# Patient Record
Sex: Male | Born: 1990 | Race: White | Hispanic: No | Marital: Single | Smoking: Never smoker
Health system: Southern US, Community
[De-identification: ages and names within clinical notes are randomized; demographics above are authoritative.]

## PROBLEM LIST (undated history)

## (undated) HISTORY — PX: CYST EXCISION: SHX5701

## (undated) HISTORY — PX: VARICOCELE EXCISION: SUR582

---

## 2015-08-11 ENCOUNTER — Encounter: Payer: Self-pay | Admitting: Emergency Medicine

## 2015-08-11 ENCOUNTER — Ambulatory Visit
Admission: EM | Admit: 2015-08-11 | Discharge: 2015-08-11 | Disposition: A | Discharge status: Home / Self Care | Payer: 59 | Attending: Family Medicine | Admitting: Family Medicine

## 2015-08-11 DIAGNOSIS — J029 Acute pharyngitis, unspecified: Secondary | ICD-10-CM

## 2015-08-11 DIAGNOSIS — J019 Acute sinusitis, unspecified: Secondary | ICD-10-CM | POA: Diagnosis not present

## 2015-08-11 LAB — RAPID STREP SCREEN (MED CTR MEBANE ONLY): STREPTOCOCCUS, GROUP A SCREEN (DIRECT): NEGATIVE

## 2015-08-11 MED ORDER — AZITHROMYCIN 250 MG PO TABS: ORAL_TABLET | ORAL | Status: DC

## 2015-08-11 MED ORDER — FEXOFENADINE-PSEUDOEPHED ER 180-240 MG PO TB24: 1.0000 | ORAL_TABLET | Freq: Every day | ORAL | Status: DC

## 2015-08-11 MED ORDER — HYDROCOD POLST-CPM POLST ER 10-8 MG/5ML PO SUER
5.0000 mL | Freq: Two times a day (BID) | ORAL | Status: DC | PRN
Start: 2015-08-11 — End: 2020-02-19

## 2015-08-11 NOTE — ED Provider Notes (Signed)
CSN: [id number]     Arrival date & time 08/11/15  1821 History   First MD Initiated Contact with Patient 08/11/15 1922    Nurses notes were reviewed. Chief Complaint  Patient presents with  . Chest Pain  . Cough   Patient was having a cough for week reports nasal congestion dripping down the back of his throat. He reports status post and sore and is unable to sleep because of the congestion.  (Consider location/radiation/quality/duration/timing/severity/associated sxs/prior Treatment) Patient is a 24 y.o. male presenting with cough and URI. The history is provided by the patient. No language interpreter was used.  Cough Associated symptoms: no headaches and no myalgias   URI Presenting symptoms: cough   Severity:  Moderate Timing:  Constant Progression:  Worsening Chronicity:  New Relieved by:  Nothing Ineffective treatments:  None tried Associated symptoms: sinus pain and swollen glands   Associated symptoms: no arthralgias, no headaches, no myalgias, no neck pain and no sneezing   Risk factors: not elderly, no chronic cardiac disease, no chronic kidney disease, no chronic respiratory disease, no diabetes mellitus, no recent illness and no recent travel      History reviewed. No pertinent past medical history. Past Surgical History  Procedure Laterality Date  . Cyst excision     History reviewed. No pertinent family history. Social History  Substance Use Topics  . Smoking status: Never Smoker   . Smokeless tobacco: None  . Alcohol Use: Yes    Review of Systems  HENT: Negative for sneezing.   Respiratory: Positive for cough.   Musculoskeletal: Negative for myalgias, arthralgias and neck pain.  Neurological: Negative for headaches.  All other systems reviewed and are negative.   Allergies  Review of patient's allergies indicates no known allergies.  Home Medications   Prior to Admission medications   Medication Sig Start Date End Date Taking? Authorizing  Provider  Dextromethorphan-Guaifenesin (ROBITUSSIN DM PO) Take by mouth.   Yes Historical Provider, MD  Pseudoephedrine HCl (SUDAFED 24 HOUR PO) Take by mouth.   Yes Historical Provider, MD  azithromycin (ZITHROMAX Z-PAK) 250 MG tablet Take 2 tablets first day and then 1 po a day for 4 days 08/11/15   Hassan RowanEugene Marquavion Venhuizen, MD  chlorpheniramine-HYDROcodone [facility](TUSSIONEX PENNKINETIC ER) 10-8 MG/5ML SUER Take 5 mLs by mouth every 12 (twelve) hours as needed for cough. 08/11/15   Hassan RowanEugene Cabria Micalizzi, MD  fexofenadine-pseudoephedrine (ALLEGRA-D ALLERGY & CONGESTION) 180-240 MG 24 hr tablet Take 1 tablet by mouth daily. 08/11/15   Hassan RowanEugene Annalea Alguire, MD   Meds Ordered and Administered this Visit  Medications - No data to display  BP 126/75 mmHg  Pulse 72  Temp(Src) 98 F (36.7 C) (Oral)  Resp 18  Ht 6\' 2"  (1.88 m)  Wt 215 lb (97.523 kg)  BMI 27.59 kg/m2  SpO2 99% No data found.   Physical Exam  Constitutional: He is oriented to person, place, and time. He appears well-developed and well-nourished.  HENT:  Head: Normocephalic and atraumatic.  Right Ear: External ear normal.  Left Ear: External ear normal.  Mouth/Throat: Oropharynx is clear and moist.  Eyes: Pupils are equal, round, and reactive to light.  Neck: Normal range of motion. Neck supple.  Cardiovascular: Normal rate, regular rhythm and normal heart sounds.   Pulmonary/Chest: Effort normal and breath sounds normal.  Musculoskeletal: Normal range of motion. He exhibits edema.  Lymphadenopathy:    He has cervical adenopathy.  Neurological: He is alert and oriented to person, place, and time.  Skin: Skin  is warm and dry.  Vitals reviewed.   ED Course  Procedures (including critical care time)  Labs Review Labs Reviewed  RAPID STREP SCREEN (NOT AT [facility])  CULTURE, GROUP A STREP ([facility] ONLY)    Imaging Review No results found.   Visual Acuity Review  Right Eye Distance:   Left Eye Distance:   Bilateral Distance:    Right Eye Near:   Left  Eye Near:    Bilateral Near:     Results for orders placed or performed during the hospital encounter of 08/11/15  Rapid strep screen  Result Value Ref Range   Streptococcus, Group A Screen (Direct) NEGATIVE NEGATIVE      MDM   1. Acute sinusitis, recurrence not specified, unspecified location   2. Pharyngitis    Due to congestion and cough lasting for over a week we'll place him on Z-Pak for the nocturnal cough Tussionex 1 teaspoon twice a day and Allegra-D. If strep test is positive we'll change to amoxicillin. Work note for tomorrow given as well. Follow-up PCP if not better or improving.,    Hassan Rowan, MD 08/11/15 2019

## 2015-08-11 NOTE — Discharge Instructions (Signed)
Pharyngitis Pharyngitis is a sore throat (pharynx). There is redness, pain, and swelling of your throat. HOME CARE   Drink enough fluids to keep your pee (urine) clear or pale yellow.  Only take medicine as told by your doctor.  You may get sick again if you do not take medicine as told. Finish your medicines, even if you start to feel better.  Do not take aspirin.  Rest.  Rinse your mouth (gargle) with salt water ( tsp of salt per 1 qt of water) every 1-2 hours. This will help the pain.  If you are not at risk for choking, you can suck on hard candy or sore throat lozenges. GET HELP IF:  You have large, tender lumps on your neck.  You have a rash.  You cough up green, yellow-brown, or bloody spit. GET HELP RIGHT AWAY IF:   You have a stiff neck.  You drool or cannot swallow liquids.  You throw up (vomit) or are not able to keep medicine or liquids down.  You have very bad pain that does not go away with medicine.  You have problems breathing (not from a stuffy nose). MAKE SURE YOU:   Understand these instructions.  Will watch your condition.  Will get help right away if you are not doing well or get worse.   This information is not intended to replace advice given to you by your health care provider. Make sure you discuss any questions you have with your health care provider.   Document Released: 01/24/2008 Document Revised: 05/28/2013 Document Reviewed: 04/14/2013 Elsevier Interactive Patient Education 2016 Elsevier Inc.  Sinusitis, Adult Sinusitis is redness, soreness, and puffiness (inflammation) of the air pockets in the bones of your face (sinuses). The redness, soreness, and puffiness can cause air and mucus to get trapped in your sinuses. This can allow germs to grow and cause an infection.  HOME CARE   Drink enough fluids to keep your pee (urine) clear or pale yellow.  Use a humidifier in your home.  Run a hot shower to create steam in the bathroom.  Sit in the bathroom with the door closed. Breathe in the steam 3-4 times a day.  Put a warm, moist washcloth on your face 3-4 times a day, or as told by your doctor.  Use salt water sprays (saline sprays) to wet the thick fluid in your nose. This can help the sinuses drain.  Only take medicine as told by your doctor. GET HELP RIGHT AWAY IF:   Your pain gets worse.  You have very bad headaches.  You are sick to your stomach (nauseous).  You throw up (vomit).  You are very sleepy (drowsy) all the time.  Your face is puffy (swollen).  Your vision changes.  You have a stiff neck.  You have trouble breathing. MAKE SURE YOU:   Understand these instructions.  Will watch your condition.  Will get help right away if you are not doing well or get worse.   This information is not intended to replace advice given to you by your health care provider. Make sure you discuss any questions you have with your health care provider.   Document Released: 01/24/2008 Document Revised: 08/28/2014 Document Reviewed: 03/12/2012 Elsevier Interactive Patient Education [employer] Elsevier Inc.

## 2015-08-11 NOTE — ED Notes (Signed)
Pt reports cough for a week, then chest pain started thinks from coughing so much. Pt denies SOB.

## 2015-08-13 LAB — CULTURE, GROUP A STREP (THRC)

## 2017-04-12 ENCOUNTER — Encounter: Payer: Self-pay | Admitting: Emergency Medicine

## 2017-04-12 ENCOUNTER — Emergency Department
Admission: EM | Admit: 2017-04-12 | Discharge: 2017-04-12 | Disposition: A | Discharge status: Home / Self Care | Payer: BLUE CROSS/BLUE SHIELD | Attending: Emergency Medicine | Admitting: Emergency Medicine

## 2017-04-12 ENCOUNTER — Emergency Department: Payer: BLUE CROSS/BLUE SHIELD

## 2017-04-12 DIAGNOSIS — Y999 Unspecified external cause status: Secondary | ICD-10-CM | POA: Diagnosis not present

## 2017-04-12 DIAGNOSIS — Z79899 Other long term (current) drug therapy: Secondary | ICD-10-CM | POA: Insufficient documentation

## 2017-04-12 DIAGNOSIS — S199XXA Unspecified injury of neck, initial encounter: Secondary | ICD-10-CM | POA: Diagnosis present

## 2017-04-12 DIAGNOSIS — S161XXA Strain of muscle, fascia and tendon at neck level, initial encounter: Secondary | ICD-10-CM

## 2017-04-12 DIAGNOSIS — Y92410 Unspecified street and highway as the place of occurrence of the external cause: Secondary | ICD-10-CM | POA: Diagnosis not present

## 2017-04-12 DIAGNOSIS — Y939 Activity, unspecified: Secondary | ICD-10-CM | POA: Diagnosis not present
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
## 2017-04-12 NOTE — ED Notes (Signed)
Patient transported to CT 

## 2017-04-12 NOTE — Discharge Instructions (Signed)
Use Advil or Tylenol for the pain. Return if you get a bad headache worse neck pain fever vomiting or any other new symptoms. Follow-up with your doctor next week unless your well.
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
## 2017-04-12 NOTE — ED Notes (Signed)
Pt had dog tags necklace on. Given to dad at bedside.

## 2017-04-12 NOTE — ED Notes (Signed)
ED Provider at bedside. 
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
## 2019-11-24 ENCOUNTER — Ambulatory Visit: Payer: BC Managed Care – PPO | Attending: Internal Medicine

## 2019-11-24 DIAGNOSIS — Z23 Encounter for immunization: Secondary | ICD-10-CM

## 2019-11-24 NOTE — Progress Notes (Signed)
   Covid-19 Vaccination Clinic  Name:  Andrew Stanley    MRN: [id number] DOB: [birth year]  11/24/2019  [name] was observed post Covid-19 immunization for 15 minutes without incident. He was provided with Vaccine Information Sheet and instruction to access the V-Safe system.   [name] was instructed to call 911 with any severe reactions post vaccine: Marland Kitchen Difficulty breathing  . Swelling of face and throat  . A fast heartbeat  . A bad rash all over body  . Dizziness and weakness   Immunizations Administered    Name Date Dose VIS Date Route   Pfizer COVID-19 Vaccine 11/24/2019  8:50 AM 0.3 mL 08/01/2019 Intramuscular   Manufacturer: [employer], [employer]   Lot: [id number]   NDC: [address]-8

## 2019-12-17 ENCOUNTER — Ambulatory Visit: Payer: BC Managed Care – PPO | Attending: Internal Medicine

## 2019-12-17 DIAGNOSIS — Z23 Encounter for immunization: Secondary | ICD-10-CM

## 2019-12-17 NOTE — Progress Notes (Signed)
   Covid-19 Vaccination Clinic  Name:  HORRIS SPEROS    MRN: [id number] DOB: October 04, 1990  12/17/2019  Mr. Grounds was observed post Covid-19 immunization for 15 minutes without incident. He was provided with Vaccine Information Sheet and instruction to access the V-Safe system.   Mr. Rafter was instructed to call 911 with any severe reactions post vaccine: Marland Kitchen Difficulty breathing  . Swelling of face and throat  . A fast heartbeat  . A bad rash all over body  . Dizziness and weakness   Immunizations Administered    Name Date Dose VIS Date Route   Pfizer COVID-19 Vaccine 12/17/2019  9:30 AM 0.3 mL 10/15/2018 Intramuscular   Manufacturer: [employer], [employer]   Lot: [id number]   NDC: [address]-6

## 2020-02-19 ENCOUNTER — Other Ambulatory Visit: Payer: Self-pay

## 2020-02-19 ENCOUNTER — Emergency Department: Payer: BC Managed Care – PPO

## 2020-02-19 ENCOUNTER — Emergency Department
Admission: EM | Admit: 2020-02-19 | Discharge: 2020-02-19 | Disposition: A | Discharge status: Home / Self Care | Payer: BC Managed Care – PPO | Attending: Emergency Medicine | Admitting: Emergency Medicine

## 2020-02-19 DIAGNOSIS — R079 Chest pain, unspecified: Secondary | ICD-10-CM

## 2020-02-19 LAB — CBC
HCT: 39.9 % (ref 39.0–52.0)
Hemoglobin: 14.2 g/dL (ref 13.0–17.0)
MCH: 33.4 pg (ref 26.0–34.0)
MCHC: 35.6 g/dL (ref 30.0–36.0)
MCV: 93.9 fL (ref 80.0–100.0)
Platelets: 156 10*3/uL (ref 150–400)
RBC: 4.25 MIL/uL (ref 4.22–5.81)
RDW: 11.9 % (ref 11.5–15.5)
WBC: 5.6 10*3/uL (ref 4.0–10.5)
nRBC: 0 % (ref 0.0–0.2)

## 2020-02-19 LAB — BASIC METABOLIC PANEL
Anion gap: 8 (ref 5–15)
BUN: 19 mg/dL (ref 6–20)
CO2: 28 mmol/L (ref 22–32)
Calcium: 9.2 mg/dL (ref 8.9–10.3)
Chloride: 105 mmol/L (ref 98–111)
Creatinine, Ser: 1.18 mg/dL (ref 0.61–1.24)
GFR calc Af Amer: >60 mL/min (ref >60)
GFR calc non Af Amer: >60 mL/min (ref >60)
Glucose, Bld: 123 mg/dL — ABNORMAL HIGH (ref 70–99)
Potassium: 3.6 mmol/L (ref 3.5–5.1)
Sodium: 141 mmol/L (ref 135–145)

## 2020-02-19 LAB — TROPONIN I (HIGH SENSITIVITY)
Troponin I (High Sensitivity): 3 ng/L (ref <18)
Troponin I (High Sensitivity): <2 ng/L (ref <18)

## 2020-02-19 NOTE — ED Provider Notes (Signed)
[facility] Emergency Department Provider Note ____________________________________________   First MD Initiated Contact with Patient 02/19/20 [phone]     (approximate)  I have reviewed the triage vital signs and the nursing notes.   HISTORY  Chief Complaint Chest Pain    HPI Andrew Stanley is a 29 y.o. male with no chronic medical issues who presents for evaluation of about 4 days of intermittent but recurrent pain in his chest.  He reports that the pain is in the center of his chest, sometimes at the lower part and sometimes rating up higher, and feels like a dull aching pressure.  It is worse at night and when he is lying flat.  He has been trying not to worry about it but tonight when he was trying to sleep it was severe and nothing in particular was making it better so he came to the emergency department.  He also reports that he was having what folic some numbness in his left arm but that went away after he got to the emergency department.  He has had no focal weakness.  He denies fever, chills, nausea, vomiting, sore throat, shortness of breath, cough, abdominal pain, and dysuria.  He has not had similar symptoms in the past.  He is unaware of having any acid reflux.   He does not have a history of blood clots in the legs of the lungs.      History reviewed. No pertinent past medical history.  There are no problems to display for this patient.   Past Surgical History:  Procedure Laterality Date  . CYST EXCISION    . VARICOCELE EXCISION      Prior to Admission medications   Medication Sig Start Date End Date Taking? Authorizing Provider  Dextromethorphan-Guaifenesin (ROBITUSSIN DM PO) Take by mouth.   Yes [provider]  fexofenadine (ALLEGRA ALLERGY) 180 MG tablet Take 180 mg by mouth daily.   Yes [provider]  Pseudoephedrine HCl (SUDAFED 24 HOUR PO) Take by mouth.   Yes [provider]    Allergies Patient has  no known allergies.  No family history on file.  Social History Social History   Tobacco Use  . Smoking status: Never Smoker  . Smokeless tobacco: Never Used  Substance Use Topics  . Alcohol use: Yes  . Drug use: Not on file    Review of Systems Constitutional: No fever/chills Eyes: No visual changes. ENT: No sore throat. Cardiovascular: +chest pain. Respiratory: Denies shortness of breath. Gastrointestinal: No abdominal pain.  No nausea, no vomiting.  No diarrhea.  No constipation. Genitourinary: Negative for dysuria. Musculoskeletal: Negative for neck pain.  Negative for back pain. Integumentary: Negative for rash. Neurological: Negative for headaches, focal weakness or numbness.   ____________________________________________   PHYSICAL EXAM:  VITAL SIGNS: ED Triage Vitals  Enc Vitals Group     BP 02/19/20 0053 117/69     Pulse Rate 02/19/20 0053 79     Resp 02/19/20 0053 18     Temp 02/19/20 0053 98.4 F (36.9 C)     Temp Source 02/19/20 0053 Oral     SpO2 02/19/20 0053 98 %     Weight 02/19/20 0051 90.7 kg (200 lb)     Height 02/19/20 0051 1.88 m (6\' 2" )     Head Circumference --      Peak Flow --      Pain Score 02/19/20 0051 2     Pain Loc --  Pain Edu? --      Excl. in GC? --     Constitutional: Alert and oriented.  Eyes: Conjunctivae are normal.  Head: Atraumatic. Nose:  No congestion/rhinnorhea. Mouth/Throat: Patient is wearing a mask. Neck: No stridor.  No meningeal signs.   Cardiovascular: Normal rate, regular rhythm. Good peripheral circulation. Grossly normal heart sounds. Respiratory: Normal respiratory effort.  No retractions. Gastrointestinal: Soft and nontender. No distention.  Musculoskeletal: No lower extremity tenderness nor edema. No gross deformities of extremities. Neurologic:  Normal speech and language. No gross focal neurologic deficits are appreciated.  Skin:  Skin is warm, dry and intact. Psychiatric: Mood and affect are  normal. Speech and behavior are normal.  ____________________________________________   LABS (all labs ordered are listed, but only abnormal results are displayed)  Labs Reviewed  BASIC METABOLIC PANEL - Abnormal; Notable for the following components:      Result Value   Glucose, Bld 123 (*)    All other components within normal limits  CBC  TROPONIN I (HIGH SENSITIVITY)  TROPONIN I (HIGH SENSITIVITY)   ____________________________________________  EKG  ED ECG REPORT I, Loleta Rose, the attending physician, personally viewed and interpreted this ECG.  Date: 02/19/2020 EKG Time: 00: 48 Rate: 81 Rhythm: normal sinus rhythm QRS Axis: Right superior axis deviation Intervals: normal ST/T Wave abnormalities: Inverted T waves in leads II, III, and aVF.  No other abnormalities are identified including no reciprocal changes or ST depression/elevation. Narrative Interpretation: Abnormal T waves but without definitive evidence of ischemia.  No indication of pericarditis.  ____________________________________________  RADIOLOGY I, Loleta Rose, personally viewed and evaluated these images (plain radiographs) as part of my medical decision making, as well as reviewing the written report by the radiologist.  ED MD interpretation: No acute abnormalities on chest x-ray.  Official radiology report(s): DG Chest 2 View  Result Date: 02/19/2020 CLINICAL DATA:  Chest pain and shortness of breath since Monday, left arm numbness EXAM: CHEST - 2 VIEW COMPARISON:  None. FINDINGS: Frontal and lateral views of the chest demonstrate an unremarkable cardiac silhouette. No airspace disease, effusion, or pneumothorax. No acute bony abnormalities. IMPRESSION: 1. No acute intrathoracic process. Electronically Signed   By: Sharlet Salina M.D.   On: 02/19/2020 01:48    ____________________________________________   PROCEDURES   Procedure(s) performed (including Critical  Care):  Procedures   ____________________________________________   INITIAL IMPRESSION / MDM / ASSESSMENT AND PLAN / ED COURSE  As part of my medical decision making, I reviewed the following data within the electronic MEDICAL RECORD NUMBER Nursing notes reviewed and incorporated, Labs reviewed , EKG interpreted , Old chart reviewed, Radiograph reviewed  and Notes from prior ED visits   Differential diagnosis includes, but is not limited to, acid reflux, biliary colic, ACS, PE, pneumonia, pneumothorax, musculoskeletal strain, costochondritis.  The patient's work-up was generally reassuring.  Vital signs have been stable.  2 troponins were negative.  The patient's basic metabolic panel is normal and his CBC is normal.  Chest x-ray is unremarkable.  He has no tenderness to palpation of his abdomen including epigastrium and right upper quadrant.  His EKG has inverted T waves in leads II, III, and aVF.  However given his very low risk and HEAR score for ACS, lack of comorbidities, reassuring history of present illness, 2 - troponins, and strong suspicion that he is suffering from acid reflux, I do not think that it warrants further evaluation at this time.  I explained that his findings are generally reassuring  and that I do not think he is suffering from cardiac chest pain at this time.  I encouraged him to try taking over-the-counter medicines such as Prilosec or Prevacid and I provided information for a cardiologist with whom he can follow-up if he would like to do so.  He also has a primary care doctor with whom he can follow.  He understands and agrees with the plan and I gave my usual and customary return precautions.         ____________________________________________  FINAL CLINICAL IMPRESSION(S) / ED DIAGNOSES  Final diagnoses:  Chest pain, unspecified type     MEDICATIONS GIVEN DURING THIS VISIT:  Medications - No data to display   ED Discharge Orders    None      *Please  note:  Andrew Stanley was evaluated in Emergency Department on 02/19/2020 for the symptoms described in the history of present illness. He was evaluated in the context of the global COVID-19 pandemic, which necessitated consideration that the patient might be at risk for infection with the SARS-CoV-2 virus that causes COVID-19. Institutional protocols and algorithms that pertain to the evaluation of patients at risk for COVID-19 are in a state of rapid change based on information released by regulatory bodies including the CDC and federal and state organizations. These policies and algorithms were followed during the patient's care in the ED.  Some ED evaluations and interventions may be delayed as a result of limited staffing during and after the pandemic.*  Note:  This document was prepared using Dragon voice recognition software and may include unintentional dictation errors.   Loleta Rose, MD 02/19/20 [phone]

## 2020-02-19 NOTE — Discharge Instructions (Signed)
You have been seen in the Emergency Department (ED) today for chest pain.  As we have discussed today's test results are reassuring.  I think most likely you are suffering from a degree of acid reflux since the pain is worse at night and when you are lying flat.  I recommend you try taking an over-the-counter antacid such as Prilosec or Prevacid.  Take the medication for the next couple of weeks and see if your symptoms improve.  However, please follow up with the recommended doctor as instructed above in these documents regarding today's emergent visit and your recent symptoms to discuss further management.  If you would like to follow-up with cardiologist, you certainly may do so and can call the number listed for Dr. Mariah Milling or one of his associates and they will schedule you for a follow-up appointment for additional testing, such as possibly a stress test or other evaluation.    Return to the Emergency Department (ED) if you experience any further chest pain/pressure/tightness, difficulty breathing, or sudden sweating, or other symptoms that concern you.

## 2020-02-19 NOTE — ED Triage Notes (Signed)
Pt arrives to ED via [facility] from home with c/o chest pain since Monday. Pt reports (+) SHOB; no N/V/D or diaphoresis. Pt reports the CP is centralized with radiation into the neck with "numbness" in the left arm. Pt was given 324 Aspirin by EMS while en route to ED. Pt is A&O, in NAD; RR even, regular, and unlabored.

## 2021-12-16 IMAGING — CR DG CHEST 2V
2 series · 2 of 2 positions shown · non-contrast
Comparison: None.

CLINICAL DATA: Chest pain and shortness of breath since [REDACTED],
left arm numbness

EXAM:
CHEST - 2 VIEW

[chest pa]
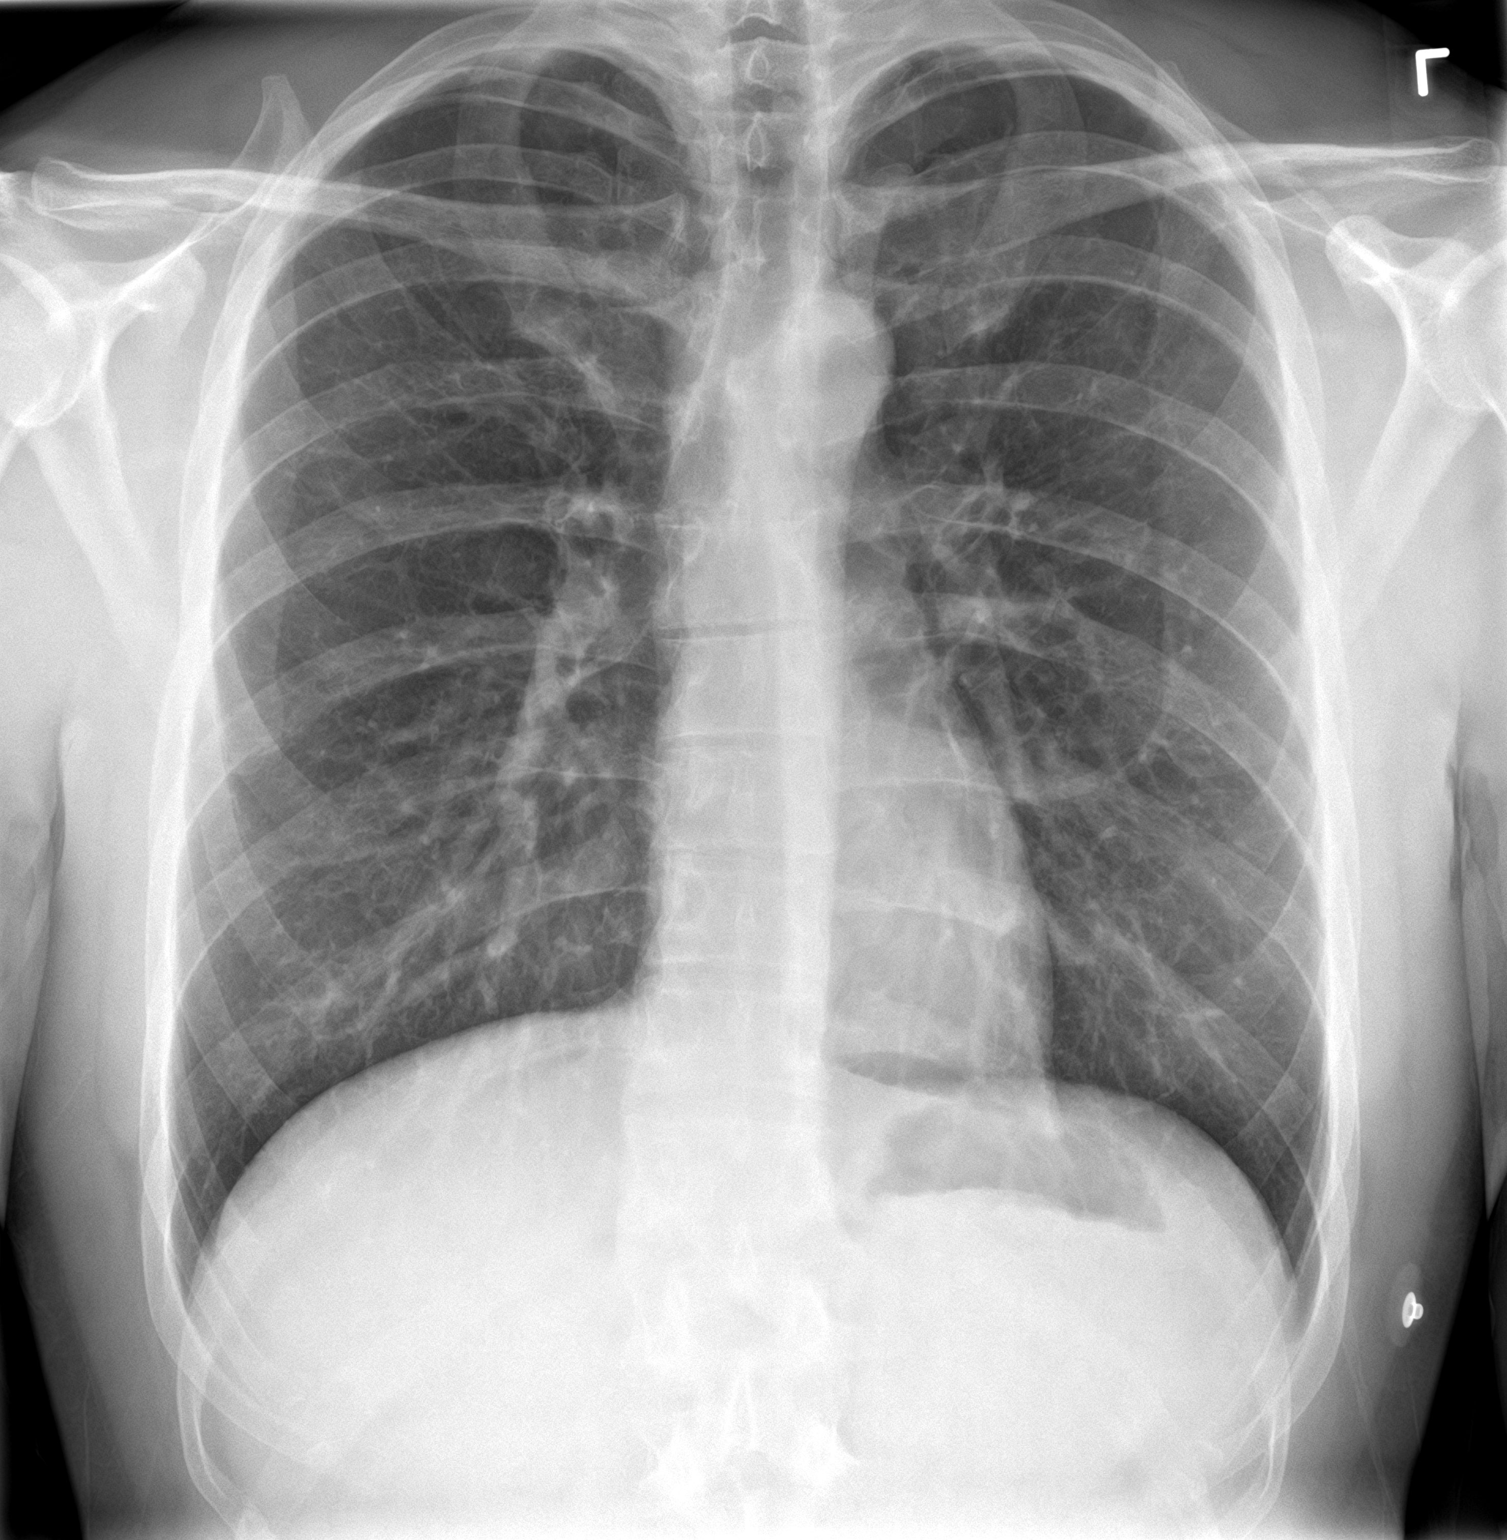

[chest lat]
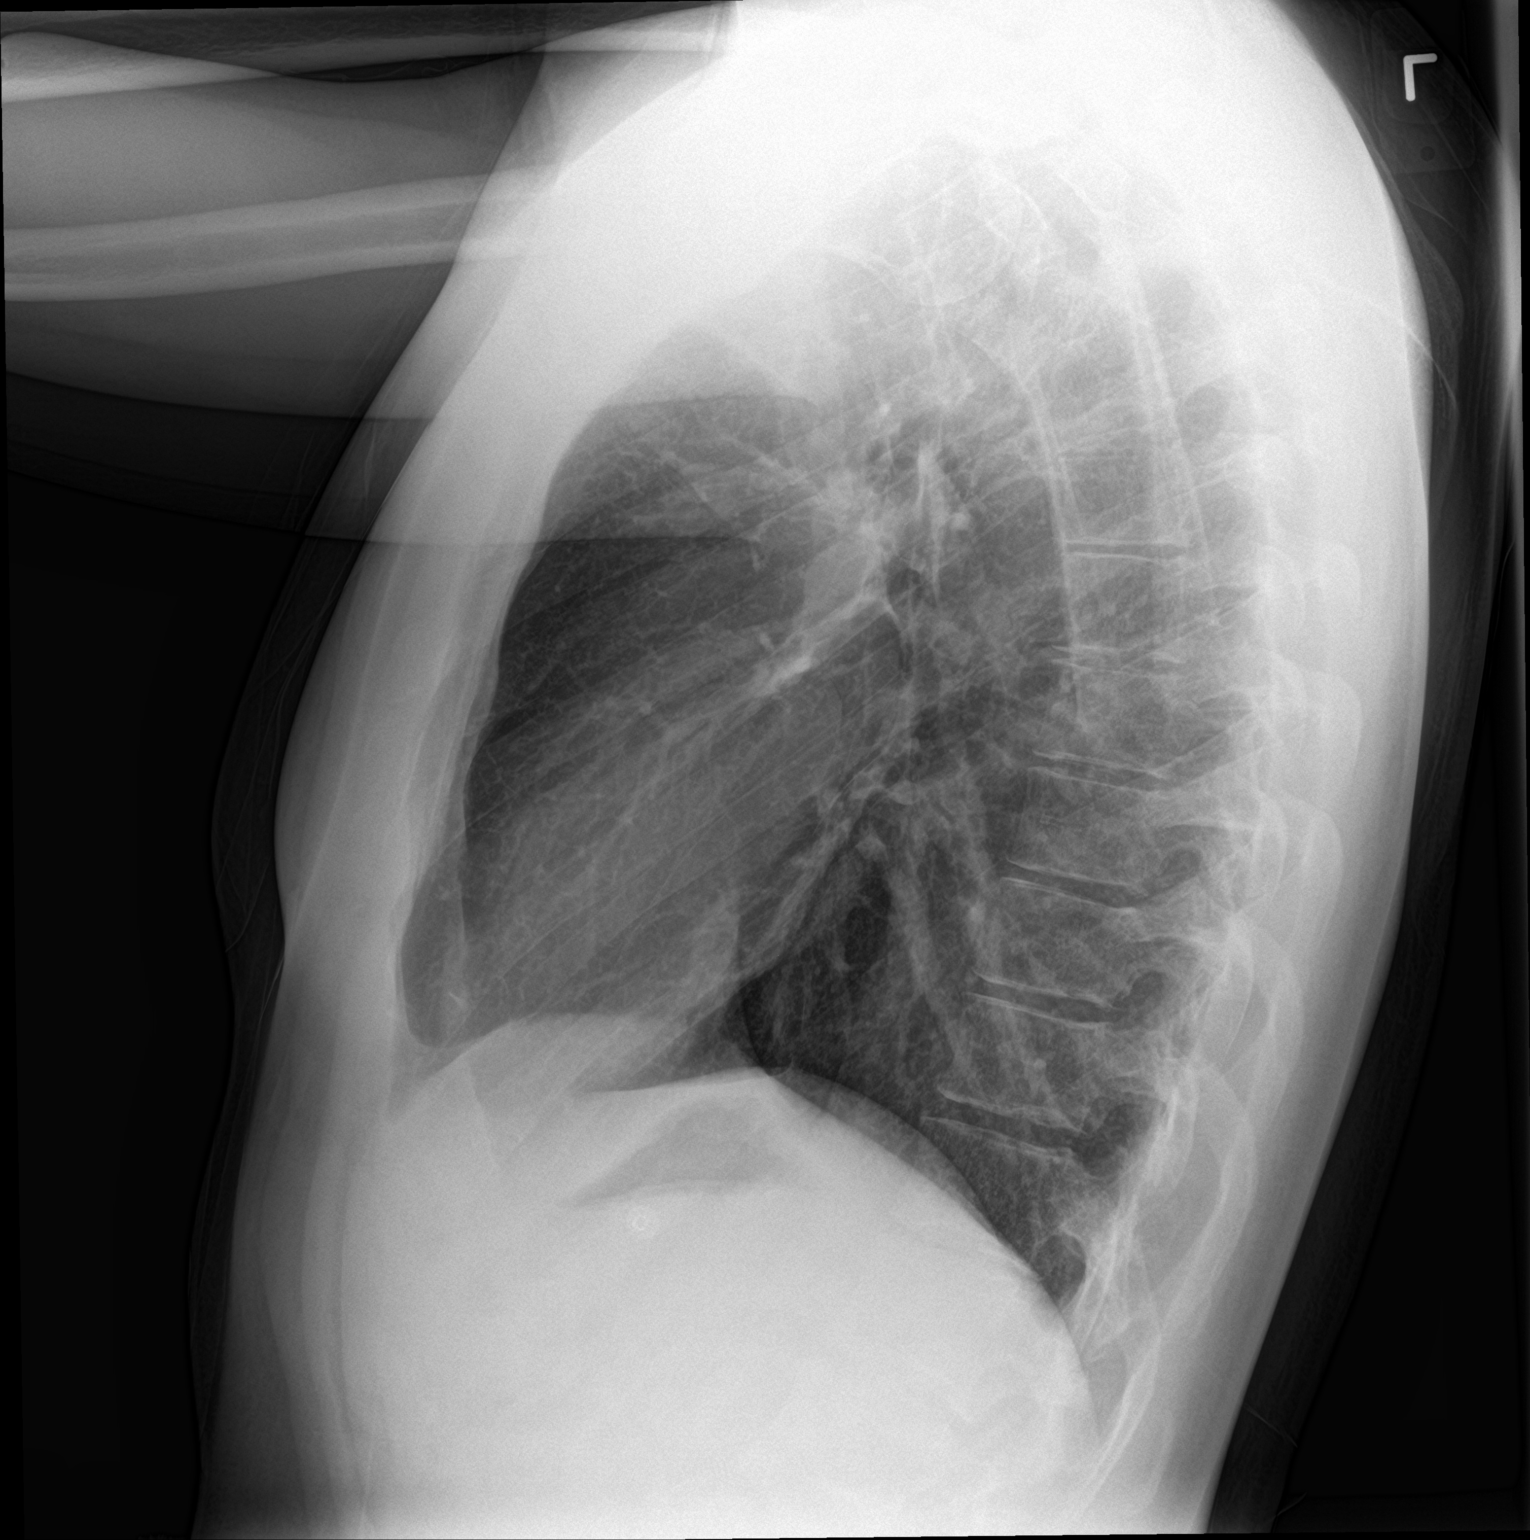

[2 of 2 positions shown; findings below may reference images not displayed]

FINDINGS: Frontal and lateral views of the chest demonstrate an unremarkable
cardiac silhouette. No airspace disease, effusion, or pneumothorax.
No acute bony abnormalities.
IMPRESSION: 1. No acute intrathoracic process.
